# Patient Record
Sex: Male | Born: 2000 | Race: Asian | Hispanic: No | Marital: Single | Smoking: Never smoker
Health system: Southern US, Community
[De-identification: ages and names within clinical notes are randomized; demographics above are authoritative.]

## PROBLEM LIST (undated history)

## (undated) DIAGNOSIS — J45909 Unspecified asthma, uncomplicated: Secondary | ICD-10-CM

---
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
## 2022-09-22 ENCOUNTER — Ambulatory Visit: Payer: BC Managed Care – PPO | Admitting: Family

## 2022-11-11 ENCOUNTER — Encounter: Payer: Self-pay | Admitting: Internal Medicine

## 2022-11-11 ENCOUNTER — Ambulatory Visit: Payer: BC Managed Care – PPO | Admitting: Internal Medicine

## 2022-11-11 VITALS — BP 104/72 | HR 63 | Temp 98.7°F | Ht 70.0 in | Wt 170.4 lb

## 2022-11-11 DIAGNOSIS — R635 Abnormal weight gain: Secondary | ICD-10-CM

## 2022-11-11 DIAGNOSIS — J4520 Mild intermittent asthma, uncomplicated: Secondary | ICD-10-CM | POA: Diagnosis not present

## 2022-11-11 NOTE — Patient Instructions (Signed)
High protein diet  Weight training to build muscle

## 2022-11-11 NOTE — Progress Notes (Signed)
[facility] [address] [place] [address] Dept: [phone] Dept Fax: [phone]  New Patient Office Visit  Subjective:   Casey Ferguson 13-Feb-2001 11/11/2022  Chief Complaint  Patient presents with   Establish Care    Discuss maintaining weight    HPI: Casey Ferguson presents today to establish care at [facility]. Introduced to Publishing rights manager role and practice setting.  All questions answered.  Concerns: See below   ASTHMA: Isamu Gundy presents for the medical management of asthma.  Patient has history of asthma.  Has not had to use any medication in several years. Medication regimen: none Well controlled: yes  Denies worsening SHOB, cough, wheezing  WEIGHT: Patient is trying to gain weight. He states he has had intentional 10lb weight gain. He would like to be around 180lbs.  Is eating a high protein diet. Is consistently playing basketball.  Does not do any consistent weight lifting or training. Mother and father are both petite, averaging 140 to 150 pounds, height 5'5" to 5\' 7"  per patient.     The following portions of the patient's history were reviewed and updated as appropriate: past medical history, past surgical history, family history, social history, allergies, medications, and problem list.   There are no problems to display for this patient.  Past Medical History:  Diagnosis Date   Asthma    History reviewed. No pertinent surgical history. History reviewed. No pertinent family history. Outpatient Medications Prior to Visit  Medication Sig Dispense Refill   Acetaminophen (TYLENOL PO) Take 10 mLs by mouth every 6 (six) hours as needed (pain).     albuterol (PROVENTIL) (2.5 MG/3ML) 0.083% nebulizer solution Take 3 mLs (2.5 mg total) by nebulization every 6 (six) hours as needed for wheezing or shortness of breath (may give 5mg  every 6 hrs for wheezing). 75 mL 0    cetirizine (ZYRTEC) 10 MG chewable tablet Chew 1 tablet (10 mg total) by mouth daily. 30 tablet 0   Chlorpheniramine Maleate (ALLERGY PO) Take 1 tablet by mouth daily as needed (for allergies).     fluticasone (FLONASE) 50 MCG/ACT nasal spray Place 2 sprays into both nostrils daily. 16 g 0   lansoprazole (PREVACID SOLUTAB) 30 MG disintegrating tablet Take 1 tablet (30 mg total) by mouth daily. 30 tablet 0   ondansetron (ZOFRAN-ODT) 4 MG disintegrating tablet Take 1 tablet (4 mg total) by mouth every 8 (eight) hours as needed for nausea or vomiting. (Patient not taking: Reported on 02/08/2014) 3 tablet 0   pseudoephedrine-acetaminophen (TYLENOL SINUS) 30-500 MG TABS Take 1 tablet by mouth every 6 (six) hours as needed. 30 tablet 0   No facility-administered medications prior to visit.   No Known Allergies  ROS: A complete ROS was performed with pertinent positives/negatives noted in the HPI. The remainder of the ROS are negative.   Objective:   Today's Vitals   11/11/22 1553  BP: 104/72  Pulse: 63  Temp: 98.7 F (37.1 C)  TempSrc: Temporal  SpO2: 98%  Weight: 170 lb 6.4 oz (77.3 kg)  Height: 5\' 10"  (1.778 m)    GENERAL: Well-appearing, in NAD. Well nourished.  SKIN: Pink, warm and dry.  NECK: Trachea midline. Full ROM w/o pain or tenderness. No lymphadenopathy.  RESPIRATORY: Chest wall symmetrical. Respirations even and non-labored. Breath sounds clear to auscultation bilaterally.  CARDIAC: S1, S2 present, regular rate and rhythm. Peripheral pulses 2+ bilaterally.  EXTREMITIES: Without clubbing, cyanosis, or edema.  NEUROLOGIC:  Steady, even gait.  PSYCH/MENTAL STATUS: Alert, oriented x 3. Cooperative, appropriate mood and affect.   Health Maintenance Due  Topic Date Due   HPV VACCINES (1 - Male 3-dose series) Never done   HIV Screening  Never done   Hepatitis C Screening  Never done   DTaP/Tdap/Td (1 - Tdap) Never done   INFLUENZA VACCINE  10/23/2022    No results found  for any visits on 11/11/22.  Assessment & Plan:  1. Mild intermittent asthma, unspecified whether complicated - well controlled   2. Gain of weight - discussed continue high-protein diet, and begin routine weight training exercises to help build muscle    Return in about 3 months (around 02/11/2023) for Fasting Annual Physical Exam.   Salvatore Decent, FNP

## 2023-01-03 DIAGNOSIS — J0100 Acute maxillary sinusitis, unspecified: Secondary | ICD-10-CM | POA: Diagnosis not present

## 2023-01-03 DIAGNOSIS — R051 Acute cough: Secondary | ICD-10-CM | POA: Diagnosis not present

## 2023-01-03 DIAGNOSIS — J069 Acute upper respiratory infection, unspecified: Secondary | ICD-10-CM | POA: Diagnosis not present

## 2023-01-03 DIAGNOSIS — R0602 Shortness of breath: Secondary | ICD-10-CM | POA: Diagnosis not present

## 2023-01-14 ENCOUNTER — Telehealth: Payer: Self-pay | Admitting: Internal Medicine

## 2023-01-14 NOTE — Telephone Encounter (Signed)
A message has been sent to the billing dept, once it gets resolved, pt would like a cb letting him know it's all good. Pt at [phone]

## 2023-02-11 ENCOUNTER — Ambulatory Visit: Payer: BC Managed Care – PPO | Admitting: Internal Medicine

## 2023-02-11 ENCOUNTER — Encounter: Payer: Self-pay | Admitting: Internal Medicine

## 2023-02-11 VITALS — BP 120/76 | HR 61 | Temp 98.2°F | Ht 71.5 in | Wt 169.4 lb

## 2023-02-11 DIAGNOSIS — Z0000 Encounter for general adult medical examination without abnormal findings: Secondary | ICD-10-CM

## 2023-02-11 LAB — COMPREHENSIVE METABOLIC PANEL
ALT: 52 U/L (ref 0–53)
AST: 24 U/L (ref 0–37)
Albumin: 4.7 g/dL (ref 3.5–5.2)
Alkaline Phosphatase: 81 U/L (ref 39–117)
BUN: 13 mg/dL (ref 6–23)
CO2: 31 meq/L (ref 19–32)
Calcium: 9.7 mg/dL (ref 8.4–10.5)
Chloride: 102 meq/L (ref 96–112)
Creatinine, Ser: 0.9 mg/dL (ref 0.40–1.50)
GFR: 121.98 mL/min (ref >60.00)
Glucose, Bld: 85 mg/dL (ref 70–99)
Potassium: 4.4 meq/L (ref 3.5–5.1)
Sodium: 139 meq/L (ref 135–145)
Total Bilirubin: 1 mg/dL (ref 0.2–1.2)
Total Protein: 7.2 g/dL (ref 6.0–8.3)

## 2023-02-11 LAB — LIPID PANEL
Cholesterol: 179 mg/dL (ref 0–200)
HDL: 39.5 mg/dL (ref >39.00)
LDL Cholesterol: 131 mg/dL — ABNORMAL HIGH (ref 0–99)
NonHDL: 139.78
Total CHOL/HDL Ratio: 5
Triglycerides: 45 mg/dL (ref 0.0–149.0)
VLDL: 9 mg/dL (ref 0.0–40.0)

## 2023-02-11 LAB — CBC WITH DIFFERENTIAL/PLATELET
Basophils Absolute: 0 10*3/uL (ref 0.0–0.1)
Basophils Relative: 0.5 % (ref 0.0–3.0)
Eosinophils Absolute: 0.2 10*3/uL (ref 0.0–0.7)
Eosinophils Relative: 2.9 % (ref 0.0–5.0)
HCT: 46 % (ref 39.0–52.0)
Hemoglobin: 15.1 g/dL (ref 13.0–17.0)
Lymphocytes Relative: 24.3 % (ref 12.0–46.0)
Lymphs Abs: 1.3 10*3/uL (ref 0.7–4.0)
MCHC: 32.8 g/dL (ref 30.0–36.0)
MCV: 89.4 fL (ref 78.0–100.0)
Monocytes Absolute: 0.6 10*3/uL (ref 0.1–1.0)
Monocytes Relative: 11.8 % (ref 3.0–12.0)
Neutro Abs: 3.3 10*3/uL (ref 1.4–7.7)
Neutrophils Relative %: 60.5 % (ref 43.0–77.0)
Platelets: 262 10*3/uL (ref 150.0–400.0)
RBC: 5.14 Mil/uL (ref 4.22–5.81)
RDW: 13.8 % (ref 11.5–15.5)
WBC: 5.5 10*3/uL (ref 4.0–10.5)

## 2023-02-11 LAB — TSH: TSH: 0.58 u[IU]/mL (ref 0.35–5.50)

## 2023-02-11 NOTE — Progress Notes (Signed)
Subjective:   Casey Ferguson 2001-01-27 02/11/2023  CC: Chief Complaint  Patient presents with   Annual Exam    HPI: Casey Ferguson is a 22 y.o. male who presents for a routine health maintenance exam.  Labs collected at time of visit.   He is a Consulting civil engineer at [employer]. Graduating with business degree in Spring 2025.   HEALTH SCREENINGS: - PSA (50+): Not applicable  No results found for: "PSA1", "PSA"  - Colonoscopy (45+): Not applicable  - AAA Screening: Not applicable  Men age 3-75 who have ever smoked - Lung Cancer screening with low-dose CT: Not applicable Adults age 25-80 who are current cigarette smokers or quit within the last 15 years. Must have 20 pack year history.   Depression and Anxiety Screen done today and results listed below:     02/11/2023    9:24 AM 11/11/2022    3:55 PM  Depression screen PHQ 2/9  Decreased Interest 0 0  Down, Depressed, Hopeless 0 0  PHQ - 2 Score 0 0  Altered sleeping 0 0  Tired, decreased energy 1 0  Change in appetite 0 0  Feeling bad or failure about yourself  0 0  Trouble concentrating 0 0  Moving slowly or fidgety/restless 0 0  Suicidal thoughts 0 0  PHQ-9 Score 1 0  Difficult doing work/chores Not difficult at all Not difficult at all      02/11/2023    9:24 AM 11/11/2022    3:55 PM  GAD 7 : Generalized Anxiety Score  Nervous, Anxious, on Edge 0 0  Control/stop worrying 0 0  Worry too much - different things 0 0  Trouble relaxing 0 0  Restless 0 0  Easily annoyed or irritable 0 0  Afraid - awful might happen 0 0  Total GAD 7 Score 0 0  Anxiety Difficulty Not difficult at all Not difficult at all    IMMUNIZATIONS:  - Tdap: Tetanus vaccination status reviewed: declined today. - Influenza: Up to date   Past medical history, surgical history, medications, allergies, family history and social history reviewed with patient today and changes made to appropriate areas of the chart.   Past Medical History:  Diagnosis Date    Asthma     History reviewed. No pertinent surgical history.  No current outpatient medications on file prior to visit.   No current facility-administered medications on file prior to visit.    No Known Allergies   Social History   Socioeconomic History   Marital status: Single    Spouse name: Not on file   Number of children: Not on file   Years of education: Not on file   Highest education level: Bachelor's degree (e.g., BA, AB, BS)  Occupational History   Not on file  Tobacco Use   Smoking status: Never   Smokeless tobacco: Not on file  Substance and Sexual Activity   Alcohol use: Yes    Comment: rarely   Drug use: Never   Sexual activity: Yes    Birth control/protection: Condom  Other Topics Concern   Not on file  Social History Narrative   Not on file   Social Determinants of Health   Financial Resource Strain: Low Risk  (02/10/2023)   Overall Financial Resource Strain (CARDIA)    Difficulty of Paying Living Expenses: Not hard at all  Food Insecurity: No Food Insecurity (02/10/2023)   Hunger Vital Sign    Worried About Running Out of Food in the Last Year: Never  true    Ran Out of Food in the Last Year: Never true  Transportation Needs: No Transportation Needs (02/10/2023)   PRAPARE - Administrator, Civil Service (Medical): No    Lack of Transportation (Non-Medical): No  Physical Activity: Insufficiently Active (02/10/2023)   Exercise Vital Sign    Days of Exercise per Week: 2 days    Minutes of Exercise per Session: 50 min  Stress: No Stress Concern Present (02/10/2023)   [employer] of Occupational Health - Occupational Stress Questionnaire    Feeling of Stress : Not at all  Social Connections: Moderately Isolated (02/10/2023)   Social Connection and Isolation Panel [NHANES]    Frequency of Communication with Friends and Family: More than three times a week    Frequency of Social Gatherings with Friends and Family: More than three  times a week    Attends Religious Services: 1 to 4 times per year    Active Member of [employer] or Organizations: No    Attends Engineer, structural: Not on file    Marital Status: Never married  Catering manager Violence: Not on file   Social History   Tobacco Use  Smoking Status Never  Smokeless Tobacco Not on file   Social History   Substance and Sexual Activity  Alcohol Use Yes   Comment: rarely     History reviewed. No pertinent family history.   ROS: Denies fever, fatigue, unexplained weight loss/gain, hearing or vision changes, cardiac or respiratory complaints. Denies neurological deficits, musculoskeletal complaints, gastrointestinal or genitourinary complaints, mental health complaints, and skin changes.   Objective:   Today's Vitals   02/11/23 0920  BP: 120/76  Pulse: 61  Temp: 98.2 F (36.8 C)  TempSrc: Temporal  SpO2: 99%  Weight: 169 lb 6.4 oz (76.8 kg)  Height: 5' 11.5" (1.816 m)    GENERAL APPEARANCE: Well-appearing, in NAD. Well nourished.  SKIN: Pink, warm and dry. Turgor normal. No rash, lesion, ulceration, or ecchymoses. Hair evenly distributed.  HEENT: HEAD: Normocephalic.  EYES: PERRLA. EOMI. Lids intact w/o defect. Sclera white, Conjunctiva pink w/o exudate.  EARS: External ear w/o redness, swelling, masses or lesions. EAC clear. TM's intact, translucent w/o bulging, appropriate landmarks visualized. Appropriate acuity to conversational tones.  NOSE: Septum midline w/o deformity. Nares patent, mucosa pink and non-inflamed w/o drainage. No sinus tenderness.  THROAT: Uvula midline. Oropharynx clear. Tonsils non-inflamed w/o exudate . Oral mucosa pink and moist.  NECK: Supple, Trachea midline. Full ROM w/o pain or tenderness. No lymphadenopathy. Thyroid non-tender w/o enlargement or palpable masses.  RESPIRATORY: Chest wall symmetrical w/o masses. Respirations even and non-labored. Breath sounds clear to auscultation bilaterally. No wheezes,  rales, rhonchi, or crackles. CARDIAC: S1, S2 present, regular rate and rhythm. No gallops, murmurs, rubs, or clicks.  Capillary refill <2 seconds. Peripheral pulses 2+ bilaterally. GI: Abdomen soft w/o distention. Normoactive bowel sounds. No palpable masses or tenderness. No guarding or rebound tenderness. Liver and spleen w/o tenderness or enlargement. No CVA tenderness.  GU:  deferred exam. MSK: Muscle tone and strength appropriate for age, w/o atrophy or abnormal movement. EXTREMITIES: Active ROM intact, w/o tenderness, crepitus, or contracture. No obvious joint deformities or effusions. No clubbing, edema, or cyanosis.  NEUROLOGIC: CN's II-XII intact. Motor strength symmetrical with no obvious weakness. No sensory deficits. Steady, even gait.  PSYCH/MENTAL STATUS: Alert, oriented x 3. Cooperative, appropriate mood and affect.    Assessment & Plan:  Encounter for general adult medical examination without abnormal findings -  CBC with Differential/Platelet -     Comprehensive metabolic panel -     TSH -     Lipid panel    Orders Placed This Encounter  Procedures   CBC with Differential/Platelet   Comprehensive metabolic panel   TSH   Lipid panel    PATIENT COUNSELING: - Encouraged to adjust caloric intake to maintain or achieve ideal body weight, to reduce intake of dietary saturated fat and total fat, to limit sodium intake by avoiding high sodium foods and not adding table salt, and to maintain adequate dietary potassium and calcium preferably from fresh fruits, vegetables, and low-fat dairy products.   - Advised to avoid cigarette smoking. - Discussed with the patient that most people either abstain from alcohol or drink within safe limits (<=14/week and <=4 drinks/occasion for males, <=7/weeks and <= 3 drinks/occasion for females) and that the risk for alcohol disorders and other health effects rises proportionally with the number of drinks per week and how often a drinker  exceeds daily limits. - Discussed cessation/primary prevention of drug use and availability of treatment for abuse.   - Stressed the importance of regular exercise - Injury prevention: Discussed safety belts, safety helmets, smoke detector, smoking near bedding or upholstery.  - Dental health: Discussed importance of regular tooth brushing, flossing, and dental visits.  - Sexuality: Discussed sexually transmitted diseases, partner selection, use of condoms, avoidance of unintended pregnancy  and contraceptive alternatives.   NEXT PREVENTATIVE PHYSICAL DUE IN 1 YEAR.  Return in about 1 year (around 02/11/2024) for Fasting Annual Physical Exam.  Salvatore Decent, FNP

## 2023-02-26 ENCOUNTER — Telehealth: Payer: Self-pay | Admitting: Internal Medicine

## 2023-02-26 NOTE — Telephone Encounter (Signed)
Returned call to patient and Results have been relayed to the patient. The patient verbalized understanding. No questions at this time.

## 2023-02-26 NOTE — Telephone Encounter (Signed)
Please give the pt a call . Pt said you had his lab results

## 2023-04-11 DIAGNOSIS — H40033 Anatomical narrow angle, bilateral: Secondary | ICD-10-CM | POA: Diagnosis not present

## 2023-04-11 DIAGNOSIS — H04123 Dry eye syndrome of bilateral lacrimal glands: Secondary | ICD-10-CM | POA: Diagnosis not present

## 2024-02-16 ENCOUNTER — Ambulatory Visit: Payer: BC Managed Care – PPO | Admitting: Internal Medicine

## 2024-02-16 ENCOUNTER — Encounter: Payer: Self-pay | Admitting: Internal Medicine

## 2024-02-16 VITALS — BP 137/68 | HR 73 | Temp 98.6°F | Ht 70.0 in | Wt 176.4 lb

## 2024-02-16 DIAGNOSIS — Z136 Encounter for screening for cardiovascular disorders: Secondary | ICD-10-CM

## 2024-02-16 DIAGNOSIS — Z0000 Encounter for general adult medical examination without abnormal findings: Secondary | ICD-10-CM

## 2024-02-16 DIAGNOSIS — Z23 Encounter for immunization: Secondary | ICD-10-CM

## 2024-02-16 DIAGNOSIS — R030 Elevated blood-pressure reading, without diagnosis of hypertension: Secondary | ICD-10-CM | POA: Diagnosis not present

## 2024-02-16 LAB — LIPID PANEL
Cholesterol: 186 mg/dL (ref 0–200)
HDL: 44.7 mg/dL (ref >39.00)
LDL Cholesterol: 131 mg/dL — ABNORMAL HIGH (ref 0–99)
NonHDL: 141.08
Total CHOL/HDL Ratio: 4
Triglycerides: 51 mg/dL (ref 0.0–149.0)
VLDL: 10.2 mg/dL (ref 0.0–40.0)

## 2024-02-16 LAB — CBC WITH DIFFERENTIAL/PLATELET
Basophils Absolute: 0 K/uL (ref 0.0–0.1)
Basophils Relative: 0.6 % (ref 0.0–3.0)
Eosinophils Absolute: 0.1 K/uL (ref 0.0–0.7)
Eosinophils Relative: 1.7 % (ref 0.0–5.0)
HCT: 46.4 % (ref 39.0–52.0)
Hemoglobin: 15.3 g/dL (ref 13.0–17.0)
Lymphocytes Relative: 25.4 % (ref 12.0–46.0)
Lymphs Abs: 1.2 K/uL (ref 0.7–4.0)
MCHC: 33 g/dL (ref 30.0–36.0)
MCV: 87.6 fl (ref 78.0–100.0)
Monocytes Absolute: 0.5 K/uL (ref 0.1–1.0)
Monocytes Relative: 10.7 % (ref 3.0–12.0)
Neutro Abs: 3 K/uL (ref 1.4–7.7)
Neutrophils Relative %: 61.6 % (ref 43.0–77.0)
Platelets: 247 K/uL (ref 150.0–400.0)
RBC: 5.3 Mil/uL (ref 4.22–5.81)
RDW: 12.9 % (ref 11.5–15.5)
WBC: 4.9 K/uL (ref 4.0–10.5)

## 2024-02-16 LAB — COMPREHENSIVE METABOLIC PANEL WITH GFR
ALT: 28 U/L (ref 0–53)
AST: 23 U/L (ref 0–37)
Albumin: 4.7 g/dL (ref 3.5–5.2)
Alkaline Phosphatase: 71 U/L (ref 39–117)
BUN: 13 mg/dL (ref 6–23)
CO2: 31 meq/L (ref 19–32)
Calcium: 9.7 mg/dL (ref 8.4–10.5)
Chloride: 100 meq/L (ref 96–112)
Creatinine, Ser: 0.92 mg/dL (ref 0.40–1.50)
GFR: 117.96 mL/min (ref >60.00)
Glucose, Bld: 84 mg/dL (ref 70–99)
Potassium: 3.7 meq/L (ref 3.5–5.1)
Sodium: 137 meq/L (ref 135–145)
Total Bilirubin: 0.9 mg/dL (ref 0.2–1.2)
Total Protein: 7.7 g/dL (ref 6.0–8.3)

## 2024-02-16 LAB — TSH: TSH: 0.68 u[IU]/mL (ref 0.35–5.50)

## 2024-02-16 NOTE — Progress Notes (Signed)
 Subjective:   Casey Ferguson 2000-06-27 02/16/2024  CC: Chief Complaint  Patient presents with   Annual Exam    Pt is fasting bp high reading 140/79     HPI: Casey Ferguson is a 23 y.o. male who presents for a routine health maintenance exam.  Labs collected at time of visit.   Discussed the use of AI scribe software for clinical note transcription with the patient, who gave verbal consent to proceed.  History of Present Illness   Casey Ferguson is a 23 year old male who presents with concerns of high blood pressure.  He experienced a pounding headache at work, prompting a nurse to check his blood pressure, which was 150 systolic with a normal diastolic around 80. Since then, he has been monitoring his blood pressure at home and work, noting readings between 130 and 140 systolic.   He has made dietary changes, reducing his intake of fast food and greasy foods over the past three months, although he occasionally consumes a burger or fries every other week. He has also started going to the gym regularly since beginning his job four months ago.  There is a family history of high blood pressure, with both parents affected. He has never been on blood pressure medication before.  No current headaches, chest pain, difficulty breathing.     HEALTH SCREENINGS: - PSA (50+): Not applicable  No results found for: PSA1, PSA   - Colonoscopy (45+): Not applicable  - AAA Screening: Not applicable  Men age 36-75 who have ever smoked - Lung Cancer screening with low-dose CT: Not applicable-  Adults age 21-80 who are current cigarette smokers or quit within the last 15 years. Must have 20 pack year history.   IMMUNIZATIONS:  - Tdap: Tetanus vaccination status reviewed: Td vaccination indicated and given today. - Influenza: declined - will get at work. 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   Social History   Socioeconomic History   Marital status: Single    Spouse name: Not on file   Number of children: Not on file   Years of education: Not on file   Highest education level: Bachelor's degree (e.g., BA, AB, BS)  Occupational History   Not on file  Tobacco Use   Smoking status: Never   Smokeless tobacco: Not on file  Substance and Sexual Activity   Alcohol use: Yes    Comment: rarely   Drug use: Never   Sexual activity: Yes    Birth control/protection: Condom  Other Topics Concern   Not on file  Social History Narrative   Not on file   Social Drivers of Health   Financial Resource Strain: Low Risk  (02/10/2023)   Overall Financial Resource Strain (CARDIA)    Difficulty of Paying Living Expenses: Not hard at all  Food Insecurity: No Food Insecurity (02/10/2023)   Hunger Vital Sign    Worried About Running Out of Food in the Last Year: Never true    Ran Out of Food in the Last Year: Never true  Transportation Needs: No Transportation Needs (02/10/2023)   PRAPARE - Administrator, Civil Service (Medical): No    Lack of Transportation (Non-Medical): No  Physical Activity: Insufficiently Active (02/10/2023)   Exercise Vital Sign    Days of Exercise per Week: 2 days  Minutes of Exercise per Session: 50 min  Stress: No Stress Concern Present (02/10/2023)   [employer] of Occupational Health - Occupational Stress Questionnaire    Feeling of Stress : Not at all  Social Connections: Moderately Isolated (02/10/2023)   Social Connection and Isolation Panel    Frequency of Communication with Friends and Family: More than three times a week    Frequency of Social Gatherings with Friends and Family: More than three times a week    Attends Religious Services: 1 to 4 times per year    Active Member of [employer] or Organizations: No    Attends Engineer, Structural: Not on  file    Marital Status: Never married  Intimate Partner Violence: Not on file    Past Medical History:  Diagnosis Date   Asthma     History reviewed. No pertinent surgical history.  No current outpatient medications on file prior to visit.   No current facility-administered medications on file prior to visit.    No Known Allergies  History reviewed. No pertinent family history.   ROS: Denies fever, fatigue, unexplained weight loss/gain, hearing or vision changes, cardiac or respiratory complaints. Denies neurological deficits, musculoskeletal complaints, gastrointestinal or genitourinary complaints, mental health complaints, and skin changes.   Objective:   Today's Vitals   02/16/24 0905 02/16/24 0938  BP: (!) 140/72 137/68  Pulse: 73   Temp: 98.6 F (37 C)   TempSrc: Temporal   SpO2: 99%   Weight: 176 lb 6.4 oz (80 kg)   Height: 5' 10 (1.778 m)     GENERAL APPEARANCE: Well-appearing, in NAD. Well nourished.  SKIN: Pink, warm and dry. Turgor normal. No rash, lesion, ulceration, or ecchymoses. Hair evenly distributed.  HEENT: HEAD: Normocephalic.  EYES: PERRLA. EOMI. Lids intact w/o defect. Sclera white, Conjunctiva pink w/o exudate.  EARS: External ear w/o redness, swelling, masses or lesions. EAC clear. TM's intact, translucent w/o bulging, appropriate landmarks visualized. Appropriate acuity to conversational tones.  NOSE: Septum midline w/o deformity. Nares patent, mucosa pink and non-inflamed w/o drainage.  THROAT: Uvula midline. Oropharynx clear. Tonsils  non-inflamed w/o exudate. Oral mucosa pink and moist.  NECK: Supple, Trachea midline. Full ROM w/o pain or tenderness. No lymphadenopathy. Thyroid non-tender w/o enlargement or palpable masses.  RESPIRATORY: Chest wall symmetrical w/o masses. Respirations even and non-labored. Breath sounds clear to auscultation bilaterally. No wheezes, rales, rhonchi, or crackles. CARDIAC: S1, S2 present, regular rate and  rhythm. No gallops, murmurs, rubs, or clicks. Capillary refill <2 seconds. Peripheral pulses 2+ bilaterally. GI: Abdomen soft w/o distention. Normoactive bowel sounds. No palpable masses or tenderness. No guarding or rebound tenderness. Liver and spleen w/o tenderness or enlargement. No CVA tenderness.  GU:  deferred exam. MSK: Muscle tone and strength appropriate for age, w/o atrophy or abnormal movement. EXTREMITIES: Active ROM intact, w/o tenderness, crepitus, or contracture. No obvious joint deformities or effusions. No clubbing, edema, or cyanosis.  NEUROLOGIC: CN's II-XII intact. Motor strength symmetrical with no obvious weakness. No sensory deficits.  Steady, even gait.  PSYCH/MENTAL STATUS: Alert, oriented x 3. Cooperative, appropriate mood and affect.   Depression and Anxiety Screen done today and results listed below:     02/16/2024    9:08 AM 02/11/2023    9:24 AM 11/11/2022    3:55 PM  Depression screen PHQ 2/9  Decreased Interest 0 0 0  Down, Depressed, Hopeless 0 0 0  PHQ - 2 Score 0 0 0  Altered sleeping 0 0 0  Tired, decreased energy 1 1 0  Change in appetite 0 0 0  Feeling bad or failure about yourself  0 0 0  Trouble concentrating 0 0 0  Moving slowly or fidgety/restless 0 0 0  Suicidal thoughts 0 0 0  PHQ-9 Score 1 1  0   Difficult doing work/chores Not difficult at all Not difficult at all Not difficult at all     Data saved with a previous flowsheet row definition      02/16/2024    9:08 AM 02/11/2023    9:24 AM 11/11/2022    3:55 PM  GAD 7 : Generalized Anxiety Score  Nervous, Anxious, on Edge 0 0 0  Control/stop worrying 0 0 0  Worry too much - different things 0 0 0  Trouble relaxing 0 0 0  Restless 0 0 0  Easily annoyed or irritable 0 0 0  Afraid - awful might happen 0 0 0  Total GAD 7 Score 0 0 0  Anxiety Difficulty  Not difficult at all Not difficult at all    Assessment & Plan:  Assessment and Plan    Adult Wellness Visit Annual exam  showed no significant abnormalities. Discussed lifestyle modifications including exercise and dietary changes to reduce salt intake. - Ordered blood work to assess overall health status. - Provided blood pressure log for home monitoring. - Advised to check blood pressure at least three times a week and record readings.  Elevated blood pressure readings, not yet diagnosed as hypertension Systolic 130-150 mmHg, diastolic ~80 mmHg. Family history of hypertension. Lifestyle modifications in place. Recheck today 137/86 mmHg.  - Will consider starting antihypertensive medication based on blood pressure log and follow-up readings. - Scheduled follow-up appointment in four weeks to review blood pressure log.  Immunization administration and counseling Discussed immunization status. Recommended TDAP booster and Meningitis B vaccine series. - Administered TDAP booster - Administered first dose of Meningitis B vaccine. - Schedule follow-up appointment in six months for second dose of Meningitis B vaccine.       Orders Placed This Encounter  Procedures   Tdap vaccine greater than or equal to 7yo IM   Meningococcal B, OMV   CBC with Differential/Platelet   Comprehensive metabolic panel with GFR   TSH   Lipid panel    PATIENT COUNSELING: - Encourage to adjust caloric intake to maintain or achieve ideal body weight, to reduce intake of dietary saturated fat and total fat, to limit sodium intake by avoiding high sodium foods and not adding table salt, and to maintain adequate dietary potassium and calcium preferably from fresh fruits, vegetables, and low-fat dairy products.   - Stress the importance of regular exercise - Avoid cigarette smoking. - Discussed with the patient that most people either abstain from alcohol or drink within safe limits (<=14/week and <=4 drinks/occasion for males, <=7/weeks and <= 3 drinks/occasion for females) and that the risk for alcohol disorders and other health  effects rises proportionally with the number of drinks per week and how often a drinker exceeds daily limits. - Discussed cessation/primary prevention of drug use and availability of treatment for abuse.  - Sexuality: Discussed sexually transmitted diseases, partner selection, use of condoms, avoidance of unintended pregnancy  and contraceptive alternatives.   NEXT PREVENTATIVE PHYSICAL DUE IN 1 YEAR.  Return in about 4 weeks (around 03/15/2024) for Hypertension.  Rosina Senters, FNP

## 2024-02-29 ENCOUNTER — Encounter: Admitting: Internal Medicine

## 2024-03-08 ENCOUNTER — Ambulatory Visit: Payer: Self-pay | Admitting: Internal Medicine

## 2024-03-15 ENCOUNTER — Encounter: Payer: Self-pay | Admitting: Internal Medicine

## 2024-03-15 ENCOUNTER — Ambulatory Visit: Admitting: Internal Medicine

## 2024-03-15 VITALS — BP 128/72 | HR 88 | Temp 98.6°F | Ht 70.0 in | Wt 174.4 lb

## 2024-03-15 DIAGNOSIS — R030 Elevated blood-pressure reading, without diagnosis of hypertension: Secondary | ICD-10-CM

## 2024-03-15 DIAGNOSIS — E785 Hyperlipidemia, unspecified: Secondary | ICD-10-CM | POA: Diagnosis not present

## 2024-03-15 NOTE — Progress Notes (Signed)
 " [facility] [address] [place] [address] Dept: [phone] Dept Fax: [phone]    Subjective:   Casey Ferguson 04-Jul-2000 03/15/2024  Chief Complaint  Patient presents with   Follow-up    4 weeks BP re check    HPI: Casey Ferguson presents today for re-assessment and management of chronic medical conditions.  Discussed the use of AI scribe software for clinical note transcription with the patient, who gave verbal consent to proceed.  History of Present Illness   Casey Ferguson is a 23 year old male who presents for follow-up on elevated blood pressure readings.  He has been monitoring his blood pressure at home, noting consistent readings in the 120s systolic range, with a current reading of 120/72 mmHg. He has a family history of high blood pressure and is not currently on any medication for blood pressure management.  He is making dietary changes to manage his blood pressure, such as eating healthier and limiting fast food intake, including French fries. He occasionally consumes fast food but opts for healthier options like wraps.  He experiences headaches, which he attributes to a recent illness about four weeks ago, described as a 'coughing cold.' All symptoms from the illness have resolved.  During his last physical in November, his LDL cholesterol was elevated at 131 mg/dL, consistent with the previous year's results. He is making dietary changes to address this.  He is actively working on his fitness, attending the gym more frequently to build muscle. He is focusing on overall health and fitness.      BP Readings from Last 3 Encounters:  03/15/24 128/72  02/16/24 137/68  02/11/23 120/76     The following portions of the patient's history were reviewed and updated as appropriate: past medical history, past surgical history, family history, social history, allergies, medications, and problem list.   Patient  Active Problem List   Diagnosis Date Noted   Elevated blood pressure reading in office without diagnosis of hypertension 02/16/2024   Past Medical History:  Diagnosis Date   Asthma    History reviewed. No pertinent surgical history. History reviewed. No pertinent family history. Current Medications[1] Allergies[2]   ROS: A complete ROS was performed with pertinent positives/negatives noted in the HPI. The remainder of the ROS are negative.    Objective:   Today's Vitals   03/15/24 0908  BP: 128/72  Pulse: 88  Temp: 98.6 F (37 C)  TempSrc: Temporal  SpO2: 99%  Weight: 174 lb 6.4 oz (79.1 kg)  Height: 5' 10 (1.778 m)    GENERAL: Well-appearing, in NAD. Well nourished.  SKIN: Pink, warm and dry. RESPIRATORY: Chest wall symmetrical. Respirations even and non-labored.  PSYCH/MENTAL STATUS: Alert, oriented x 3. Cooperative, appropriate mood and affect.   Health Maintenance Due  Topic Date Due   HPV VACCINES (1 - Male 3-dose series) Never done   HIV Screening  Never done   Hepatitis C Screening  Never done   Pneumococcal Vaccine (1 of 2 - PCV) Never done   Hepatitis B Vaccines 19-59 Average Risk (1 of 3 - 19+ 3-dose series) Never done   Influenza Vaccine  10/23/2023   COVID-19 Vaccine (1 - 2025-26 season) Never done    No results found for any visits on 03/15/24.  The ASCVD Risk score (Arnett DK, et al., 2019) failed to calculate for the following reasons:   The 2019 ASCVD risk score is only valid for ages 13 to 76   * -  Cholesterol units were assumed     Assessment & Plan:  Assessment and Plan    Elevated blood pressure Blood pressure improved to 120/72 mmHg with lifestyle changes. Effective management with diet and exercise. - Continue current diet and exercise regimen. - Monitor blood pressure regularly.  Hyperlipidemia LDL cholesterol elevated at 131 mg/dL. Management with diet changes appropriate. - Continue dietary modifications. - Reassess cholesterol  levels in future visits.   No orders of the defined types were placed in this encounter.  No images are attached to the encounter or orders placed in the encounter. No orders of the defined types were placed in this encounter.   Return in about 1 year (around 03/15/2025) for Annual Physical Exam with fasting lab work.   Rosina Senters, FNP     [1] No current outpatient medications on file. [2] No Known Allergies  "

## 2024-08-17 ENCOUNTER — Ambulatory Visit

## 2025-02-21 ENCOUNTER — Encounter: Admitting: Internal Medicine
